# Patient Record
Sex: Male | Born: 1940 | Race: White | Hispanic: No | Marital: Married | Smoking: Never smoker
Health system: Southern US, Community
[De-identification: ages and names within clinical notes are randomized; demographics above are authoritative.]

## PROBLEM LIST (undated history)

## (undated) DIAGNOSIS — E039 Hypothyroidism, unspecified: Secondary | ICD-10-CM

## (undated) DIAGNOSIS — G709 Myoneural disorder, unspecified: Secondary | ICD-10-CM

## (undated) DIAGNOSIS — E079 Disorder of thyroid, unspecified: Secondary | ICD-10-CM

## (undated) DIAGNOSIS — N400 Enlarged prostate without lower urinary tract symptoms: Secondary | ICD-10-CM

## (undated) DIAGNOSIS — K5790 Diverticulosis of intestine, part unspecified, without perforation or abscess without bleeding: Secondary | ICD-10-CM

## (undated) HISTORY — PX: TONSILLECTOMY: SUR1361

## (undated) HISTORY — PX: OTHER SURGICAL HISTORY: SHX169

---

## 2018-11-16 ENCOUNTER — Encounter: Payer: Self-pay | Admitting: *Deleted

## 2018-11-17 ENCOUNTER — Ambulatory Visit: Payer: Medicare Other | Admitting: Anesthesiology

## 2018-11-17 ENCOUNTER — Encounter
Admission: RE | Disposition: A | Discharge status: Home / Self Care | Payer: Self-pay | Source: Home / Self Care | Attending: Ophthalmology

## 2018-11-17 ENCOUNTER — Ambulatory Visit
Admission: RE | Admit: 2018-11-17 | Discharge: 2018-11-17 | Disposition: A | Discharge status: Home / Self Care | Payer: Medicare Other | Attending: Ophthalmology | Admitting: Ophthalmology

## 2018-11-17 ENCOUNTER — Encounter: Payer: Self-pay | Admitting: *Deleted

## 2018-11-17 DIAGNOSIS — G629 Polyneuropathy, unspecified: Secondary | ICD-10-CM | POA: Diagnosis not present

## 2018-11-17 DIAGNOSIS — N400 Enlarged prostate without lower urinary tract symptoms: Secondary | ICD-10-CM | POA: Diagnosis not present

## 2018-11-17 DIAGNOSIS — E039 Hypothyroidism, unspecified: Secondary | ICD-10-CM | POA: Insufficient documentation

## 2018-11-17 DIAGNOSIS — Z79899 Other long term (current) drug therapy: Secondary | ICD-10-CM | POA: Diagnosis not present

## 2018-11-17 DIAGNOSIS — H2512 Age-related nuclear cataract, left eye: Secondary | ICD-10-CM | POA: Insufficient documentation

## 2018-11-17 HISTORY — DX: Benign prostatic hyperplasia without lower urinary tract symptoms: N40.0

## 2018-11-17 HISTORY — DX: Disorder of thyroid, unspecified: E07.9

## 2018-11-17 HISTORY — DX: Hypothyroidism, unspecified: E03.9

## 2018-11-17 HISTORY — DX: Diverticulosis of intestine, part unspecified, without perforation or abscess without bleeding: K57.90

## 2018-11-17 HISTORY — PX: CATARACT EXTRACTION W/PHACO: SHX586

## 2018-11-17 HISTORY — DX: Myoneural disorder, unspecified: G70.9

## 2018-11-17 SURGERY — PHACOEMULSIFICATION, CATARACT, WITH IOL INSERTION
Anesthesia: Monitor Anesthesia Care | Site: Eye | Laterality: Left

## 2018-11-17 MED ORDER — NA CHONDROIT SULF-NA HYALURON 40-17 MG/ML IO SOLN
INTRAOCULAR | Status: DC | PRN
  Administered 2018-11-17: 1 mL via INTRAOCULAR

## 2018-11-17 MED ORDER — FENTANYL CITRATE (PF) 100 MCG/2ML IJ SOLN
INTRAMUSCULAR | Status: DC | PRN
  Administered 2018-11-17: 50 ug via INTRAVENOUS

## 2018-11-17 MED ORDER — ARMC OPHTHALMIC DILATING DROPS
OPHTHALMIC | Status: AC
  Administered 2018-11-17: 1 via OPHTHALMIC
  Filled 2018-11-17: qty 0.5

## 2018-11-17 MED ORDER — MOXIFLOXACIN HCL 0.5 % OP SOLN: 1.0000 [drp] | Freq: Once | OPHTHALMIC | Status: DC

## 2018-11-17 MED ORDER — TETRACAINE HCL 0.5 % OP SOLN
OPHTHALMIC | Status: AC
  Administered 2018-11-17: 1 [drp] via OPHTHALMIC
  Filled 2018-11-17: qty 4

## 2018-11-17 MED ORDER — ARMC OPHTHALMIC DILATING DROPS
1.0000 "application " | OPHTHALMIC | Status: AC
  Administered 2018-11-17 (×2): 1 via OPHTHALMIC

## 2018-11-17 MED ORDER — EPINEPHRINE PF 1 MG/ML IJ SOLN
INTRAOCULAR | Status: DC | PRN
  Administered 2018-11-17: 1 mL via OPHTHALMIC

## 2018-11-17 MED ORDER — POVIDONE-IODINE 5 % OP SOLN
OPHTHALMIC | Status: AC
  Filled 2018-11-17: qty 30

## 2018-11-17 MED ORDER — FENTANYL CITRATE (PF) 100 MCG/2ML IJ SOLN
INTRAMUSCULAR | Status: AC
  Filled 2018-11-17: qty 2

## 2018-11-17 MED ORDER — MOXIFLOXACIN HCL 0.5 % OP SOLN
OPHTHALMIC | Status: AC
  Filled 2018-11-17: qty 3

## 2018-11-17 MED ORDER — TETRACAINE HCL 0.5 % OP SOLN
1.0000 [drp] | Freq: Once | OPHTHALMIC | Status: AC
  Administered 2018-11-17 (×2): 1 [drp] via OPHTHALMIC

## 2018-11-17 MED ORDER — LIDOCAINE HCL (PF) 4 % IJ SOLN
INTRAOCULAR | Status: DC | PRN
  Administered 2018-11-17: 2 mL via OPHTHALMIC

## 2018-11-17 MED ORDER — POVIDONE-IODINE 5 % OP SOLN
OPHTHALMIC | Status: DC | PRN
  Administered 2018-11-17: 1 via OPHTHALMIC

## 2018-11-17 MED ORDER — NA CHONDROIT SULF-NA HYALURON 40-17 MG/ML IO SOLN
INTRAOCULAR | Status: AC
  Filled 2018-11-17: qty 1

## 2018-11-17 MED ORDER — MIDAZOLAM HCL 2 MG/2ML IJ SOLN
INTRAMUSCULAR | Status: AC
  Filled 2018-11-17: qty 2

## 2018-11-17 MED ORDER — LIDOCAINE HCL (PF) 4 % IJ SOLN
INTRAMUSCULAR | Status: AC
  Filled 2018-11-17: qty 5

## 2018-11-17 MED ORDER — CARBACHOL 0.01 % IO SOLN
INTRAOCULAR | Status: DC | PRN
  Administered 2018-11-17: .5 mL via INTRAOCULAR

## 2018-11-17 MED ORDER — MIDAZOLAM HCL 2 MG/2ML IJ SOLN
INTRAMUSCULAR | Status: DC | PRN
  Administered 2018-11-17: .5 mg via INTRAVENOUS

## 2018-11-17 MED ORDER — EPINEPHRINE PF 1 MG/ML IJ SOLN
INTRAMUSCULAR | Status: AC
  Filled 2018-11-17: qty 1

## 2018-11-17 MED ORDER — MOXIFLOXACIN HCL 0.5 % OP SOLN
OPHTHALMIC | Status: DC | PRN
  Administered 2018-11-17: .2 mL via OPHTHALMIC

## 2018-11-17 MED ORDER — SODIUM CHLORIDE 0.9 % IV SOLN
INTRAVENOUS | Status: DC
  Administered 2018-11-17: 09:00:00 via INTRAVENOUS

## 2018-11-17 SURGICAL SUPPLY — 16 items
GLOVE BIO SURGEON STRL SZ8 (GLOVE) ×3 IMPLANT
GLOVE BIOGEL M 6.5 STRL (GLOVE) ×3 IMPLANT
GLOVE SURG LX 8.0 MICRO (GLOVE) ×2
GLOVE SURG LX STRL 8.0 MICRO (GLOVE) ×1 IMPLANT
GOWN STRL REUS W/ TWL LRG LVL3 (GOWN DISPOSABLE) ×2 IMPLANT
GOWN STRL REUS W/TWL LRG LVL3 (GOWN DISPOSABLE) ×4
LABEL CATARACT MEDS ST (LABEL) ×3 IMPLANT
LENS IOL TECNIS ITEC 16.5 (Intraocular Lens) ×2 IMPLANT
PACK CATARACT (MISCELLANEOUS) ×3 IMPLANT
PACK CATARACT BRASINGTON LX (MISCELLANEOUS) ×3 IMPLANT
PACK EYE AFTER SURG (MISCELLANEOUS) ×3 IMPLANT
SOL BSS BAG (MISCELLANEOUS) ×3
SOLUTION BSS BAG (MISCELLANEOUS) ×1 IMPLANT
SYR 5ML LL (SYRINGE) ×3 IMPLANT
WATER STERILE IRR 250ML POUR (IV SOLUTION) ×3 IMPLANT
WIPE NON LINTING 3.25X3.25 (MISCELLANEOUS) ×3 IMPLANT

## 2018-11-17 NOTE — H&P (Signed)
All labs reviewed. Abnormal studies sent to patients PCP when indicated.  Previous H&P reviewed, patient examined, there are NO CHANGES.  Tony Andringa Porfilio12/17/201910:12 AM

## 2018-11-17 NOTE — Discharge Instructions (Signed)
Eye Surgery Discharge Instructions    Expect mild scratchy sensation or mild soreness. DO NOT RUB YOUR EYE!  The day of surgery:  Minimal physical activity, but bed rest is not required  No reading, computer work, or close hand work  No bending, lifting, or straining.  May watch TV  For 24 hours:  No driving, legal decisions, or alcoholic beverages  Safety precautions  Eat anything you prefer: It is better to start with liquids, then soup then solid foods.  Solar shield eyeglasses should be worn for comfort in the sunlight/patch while sleeping  FOLLOW DR. PORFILIO'S EYE DROP INSTRUCTIONS.  Resume all regular medications including aspirin or Coumadin if these were discontinued prior to surgery. You may shower, bathe, shave, or wash your hair. Tylenol may be taken for mild discomfort.  Call your doctor if you experience significant pain, nausea, or vomiting, fever > 101 or other signs of infection. [phone] or [phone] Specific instructions:  Follow-up Information    Galen ManilaPorfilio, William, MD Follow up on 11/18/2018.   Specialty:  Ophthalmology Why:  at 9:45am Contact information: [address] [place] [address] [phone]

## 2018-11-17 NOTE — Anesthesia Preprocedure Evaluation (Signed)
Anesthesia Evaluation  Patient identified by MRN, date of birth, ID band Patient awake    Reviewed: Allergy & Precautions, H&P , NPO status , Patient's Chart, lab work & pertinent test results, reviewed documented beta blocker date and time   History of Anesthesia Complications Negative for: history of anesthetic complications  Airway Mallampati: I  TM Distance: >3 FB Neck ROM: full    Dental  (+) Dental Advidsory Given, Caps, Teeth Intact   Pulmonary neg pulmonary ROS,           Cardiovascular Exercise Tolerance: Good negative cardio ROS       Neuro/Psych negative neurological ROS  negative psych ROS   GI/Hepatic negative GI ROS, Neg liver ROS,   Endo/Other  Hypothyroidism   Renal/GU negative Renal ROS  negative genitourinary   Musculoskeletal   Abdominal   Peds  Hematology negative hematology ROS (+)   Anesthesia Other Findings Past Medical History: No date: BPH (benign prostatic hyperplasia) No date: Diverticulosis No date: Hypothyroidism No date: Neuromuscular disorder (HCC)     Comment:  neuropathy No date: Thyroid disorder   Reproductive/Obstetrics negative OB ROS                             Anesthesia Physical Anesthesia Plan  ASA: II  Anesthesia Plan: General and MAC   Post-op Pain Management:    Induction: Intravenous  PONV Risk Score and Plan: Midazolam  Airway Management Planned: Nasal Cannula  Additional Equipment:   Intra-op Plan:   Post-operative Plan:   Informed Consent: I have reviewed the patients History and Physical, chart, labs and discussed the procedure including the risks, benefits and alternatives for the proposed anesthesia with the patient or authorized representative who has indicated his/her understanding and acceptance.   Dental Advisory Given  Plan Discussed with: Anesthesiologist, CRNA and Surgeon  Anesthesia Plan Comments:          Anesthesia Quick Evaluation

## 2018-11-17 NOTE — Op Note (Signed)
PREOPERATIVE DIAGNOSIS:  Nuclear sclerotic cataract of the left eye.   POSTOPERATIVE DIAGNOSIS:  Nuclear sclerotic cataract of the left eye.   OPERATIVE PROCEDURE: Procedure(s): CATARACT EXTRACTION PHACO AND INTRAOCULAR LENS PLACEMENT (IOC) LEFT   SURGEON:  Galen ManilaWilliam Hazaiah Edgecombe, MD.   ANESTHESIA:  Anesthesiologist: Lenard SimmerKarenz, Andrew, MD  1.      Managed anesthesia care. 2.     0.211ml of Shugarcaine was instilled following the paracentesis   COMPLICATIONS:  None.   TECHNIQUE:   Stop and chop   DESCRIPTION OF PROCEDURE:  The patient was examined and consented in the preoperative holding area where the aforementioned topical anesthesia was applied to the left eye and then brought back to the Operating Room where the left eye was prepped and draped in the usual sterile ophthalmic fashion and a lid speculum was placed. A paracentesis was created with the side port blade and the anterior chamber was filled with viscoelastic. A near clear corneal incision was performed with the steel keratome. A continuous curvilinear capsulorrhexis was performed with a cystotome followed by the capsulorrhexis forceps. Hydrodissection and hydrodelineation were carried out with BSS on a blunt cannula. The lens was removed in a stop and chop  technique and the remaining cortical material was removed with the irrigation-aspiration handpiece. The capsular bag was inflated with viscoelastic and the Technis ZCB00 lens was placed in the capsular bag without complication. The remaining viscoelastic was removed from the eye with the irrigation-aspiration handpiece. The wounds were hydrated. The anterior chamber was flushed with Miostat and the eye was inflated to physiologic pressure. 0.211ml Vigamox was placed in the anterior chamber. The wounds were found to be water tight. The eye was dressed with Vigamox. The patient was given protective glasses to wear throughout the day and a shield with which to sleep tonight. The patient was also  given drops with which to begin a drop regimen today and will follow-up with me in one day. Implant Name Type Inv. Item Serial No. Manufacturer Lot No. LRB No. Used  LENS IOL DIOP 16.5 - [id number] Intraocular Lens LENS IOL DIOP 16.5 [id number]  Left 1    Procedure(s) with comments: CATARACT EXTRACTION PHACO AND INTRAOCULAR LENS PLACEMENT (IOC) LEFT (Left) - US 00:48  CDE 7.81 Fluid pack lot # [id number]  Electronically signed: Galen ManilaWilliam Kerriann Kamphuis 11/17/2018 10:40 AM

## 2018-11-17 NOTE — Anesthesia Procedure Notes (Signed)
Procedure Name: MAC Date/Time: 11/17/2018 10:18 AM Performed by: Allean Found, CRNA Pre-anesthesia Checklist: Patient identified, Emergency Drugs available, Suction available, Patient being monitored and Timeout performed Patient Re-evaluated:Patient Re-evaluated prior to induction Oxygen Delivery Method: Nasal cannula Placement Confirmation: positive ETCO2

## 2018-11-17 NOTE — Anesthesia Post-op Follow-up Note (Signed)
Anesthesia QCDR form completed.        

## 2018-11-17 NOTE — Transfer of Care (Addendum)
Immediate Anesthesia Transfer of Care Note  Patient: Tony Hays  Procedure(s) Performed: CATARACT EXTRACTION PHACO AND INTRAOCULAR LENS PLACEMENT (IOC) LEFT (Left Eye)  Patient Location: PACU and Short Stay  Anesthesia Type:MAC  Level of Consciousness: awake and alert   Airway & Oxygen Therapy: Patient Spontanous Breathing  Post-op Assessment: Report given to RN  Post vital signs: Reviewed and stable  Last Vitals:  Vitals Value Taken Time  BP  134/76  Temp    Pulse  50  Resp    SpO2  100    Last Pain: There were no vitals filed for this visit.       Complications: No apparent anesthesia complications

## 2018-11-17 NOTE — Anesthesia Postprocedure Evaluation (Signed)
Anesthesia Post Note  Patient: Tony Hays  Procedure(s) Performed: CATARACT EXTRACTION PHACO AND INTRAOCULAR LENS PLACEMENT (IOC) LEFT (Left Eye)  Patient location during evaluation: PACU Anesthesia Type: MAC Level of consciousness: awake and alert Pain management: pain level controlled Vital Signs Assessment: post-procedure vital signs reviewed and stable Respiratory status: spontaneous breathing, nonlabored ventilation, respiratory function stable and patient connected to nasal cannula oxygen Cardiovascular status: stable and blood pressure returned to baseline Postop Assessment: no apparent nausea or vomiting Anesthetic complications: no     Last Vitals:  Vitals:   11/17/18 1044  BP: 134/76  Pulse: (!) 50  Resp: 16  Temp: 36.4 C  SpO2: 100%    Last Pain:  Vitals:   11/17/18 1044  TempSrc: Temporal  PainSc: 0-No pain                 Martha Clan

## 2018-12-14 ENCOUNTER — Encounter: Payer: Self-pay | Admitting: *Deleted

## 2018-12-15 ENCOUNTER — Encounter
Admission: RE | Disposition: A | Discharge status: Home / Self Care | Payer: Self-pay | Source: Home / Self Care | Attending: Ophthalmology

## 2018-12-15 ENCOUNTER — Other Ambulatory Visit: Payer: Self-pay

## 2018-12-15 ENCOUNTER — Ambulatory Visit: Payer: Medicare Other | Admitting: Certified Registered Nurse Anesthetist

## 2018-12-15 ENCOUNTER — Ambulatory Visit
Admission: RE | Admit: 2018-12-15 | Discharge: 2018-12-15 | Disposition: A | Discharge status: Home / Self Care | Payer: Medicare Other | Attending: Ophthalmology | Admitting: Ophthalmology

## 2018-12-15 DIAGNOSIS — Z79890 Hormone replacement therapy (postmenopausal): Secondary | ICD-10-CM | POA: Insufficient documentation

## 2018-12-15 DIAGNOSIS — Z79899 Other long term (current) drug therapy: Secondary | ICD-10-CM | POA: Diagnosis not present

## 2018-12-15 DIAGNOSIS — H2511 Age-related nuclear cataract, right eye: Secondary | ICD-10-CM | POA: Diagnosis present

## 2018-12-15 DIAGNOSIS — E039 Hypothyroidism, unspecified: Secondary | ICD-10-CM | POA: Insufficient documentation

## 2018-12-15 HISTORY — PX: CATARACT EXTRACTION W/PHACO: SHX586

## 2018-12-15 SURGERY — PHACOEMULSIFICATION, CATARACT, WITH IOL INSERTION
Anesthesia: Monitor Anesthesia Care | Site: Eye | Laterality: Right

## 2018-12-15 MED ORDER — TETRACAINE HCL 0.5 % OP SOLN
1.0000 [drp] | OPHTHALMIC | Status: AC | PRN
  Administered 2018-12-15 (×3): 1 [drp] via OPHTHALMIC

## 2018-12-15 MED ORDER — ARMC OPHTHALMIC DILATING DROPS
1.0000 "application " | OPHTHALMIC | Status: AC
  Administered 2018-12-15 (×3): 1 via OPHTHALMIC

## 2018-12-15 MED ORDER — SODIUM CHLORIDE 0.9 % IV SOLN
INTRAVENOUS | Status: DC
  Administered 2018-12-15: 10:00:00 via INTRAVENOUS

## 2018-12-15 MED ORDER — NA CHONDROIT SULF-NA HYALURON 40-17 MG/ML IO SOLN
INTRAOCULAR | Status: DC | PRN
  Administered 2018-12-15: 1 mL via INTRAOCULAR

## 2018-12-15 MED ORDER — MOXIFLOXACIN HCL 0.5 % OP SOLN: 1.0000 [drp] | OPHTHALMIC | Status: DC | PRN

## 2018-12-15 MED ORDER — MIDAZOLAM HCL 2 MG/2ML IJ SOLN
INTRAMUSCULAR | Status: AC
  Filled 2018-12-15: qty 2

## 2018-12-15 MED ORDER — LIDOCAINE HCL (PF) 4 % IJ SOLN
INTRAOCULAR | Status: DC | PRN
  Administered 2018-12-15: 2 mL via OPHTHALMIC

## 2018-12-15 MED ORDER — MOXIFLOXACIN HCL 0.5 % OP SOLN
OPHTHALMIC | Status: AC
  Filled 2018-12-15: qty 3

## 2018-12-15 MED ORDER — EPINEPHRINE PF 1 MG/ML IJ SOLN
INTRAOCULAR | Status: DC | PRN
  Administered 2018-12-15: 1 mL via OPHTHALMIC

## 2018-12-15 MED ORDER — CARBACHOL 0.01 % IO SOLN
INTRAOCULAR | Status: DC | PRN
  Administered 2018-12-15: .5 mL via INTRAOCULAR

## 2018-12-15 MED ORDER — POVIDONE-IODINE 5 % OP SOLN
OPHTHALMIC | Status: DC | PRN
  Administered 2018-12-15: 1 via OPHTHALMIC

## 2018-12-15 MED ORDER — MOXIFLOXACIN HCL 0.5 % OP SOLN
OPHTHALMIC | Status: DC | PRN
  Administered 2018-12-15: .2 mL via OPHTHALMIC

## 2018-12-15 MED ORDER — ARMC OPHTHALMIC DILATING DROPS
OPHTHALMIC | Status: AC
  Administered 2018-12-15: 1 via OPHTHALMIC
  Filled 2018-12-15: qty 0.5

## 2018-12-15 MED ORDER — TETRACAINE HCL 0.5 % OP SOLN
OPHTHALMIC | Status: AC
  Administered 2018-12-15: 1 [drp] via OPHTHALMIC
  Filled 2018-12-15: qty 4

## 2018-12-15 MED ORDER — MIDAZOLAM HCL 2 MG/2ML IJ SOLN
INTRAMUSCULAR | Status: DC | PRN
  Administered 2018-12-15 (×2): 1 mg via INTRAVENOUS

## 2018-12-15 SURGICAL SUPPLY — 16 items
GLOVE BIO SURGEON STRL SZ8 (GLOVE) ×2 IMPLANT
GLOVE BIOGEL M 6.5 STRL (GLOVE) ×2 IMPLANT
GLOVE SURG LX 8.0 MICRO (GLOVE) ×1
GLOVE SURG LX STRL 8.0 MICRO (GLOVE) ×1 IMPLANT
GOWN STRL REUS W/ TWL LRG LVL3 (GOWN DISPOSABLE) ×2 IMPLANT
GOWN STRL REUS W/TWL LRG LVL3 (GOWN DISPOSABLE) ×2
LABEL CATARACT MEDS ST (LABEL) ×2 IMPLANT
LENS IOL TECNIS ITEC 16.0 (Intraocular Lens) ×1 IMPLANT
PACK CATARACT (MISCELLANEOUS) ×2 IMPLANT
PACK CATARACT BRASINGTON LX (MISCELLANEOUS) ×2 IMPLANT
PACK EYE AFTER SURG (MISCELLANEOUS) ×2 IMPLANT
SOL BSS BAG (MISCELLANEOUS) ×2
SOLUTION BSS BAG (MISCELLANEOUS) ×1 IMPLANT
SYR 5ML LL (SYRINGE) ×2 IMPLANT
WATER STERILE IRR 250ML POUR (IV SOLUTION) ×2 IMPLANT
WIPE NON LINTING 3.25X3.25 (MISCELLANEOUS) ×2 IMPLANT

## 2018-12-15 NOTE — Op Note (Signed)
PREOPERATIVE DIAGNOSIS:  Nuclear sclerotic cataract of the right eye.   POSTOPERATIVE DIAGNOSIS:  nuclear sclerotic cataract right eye   OPERATIVE PROCEDURE: Procedure(s): CATARACT EXTRACTION PHACO AND INTRAOCULAR LENS PLACEMENT (IOC) RIGHT   SURGEON:  Galen Manila, MD.   ANESTHESIA:  Anesthesiologist: Piscitello, Cleda Mccreedy, MD CRNA: Dava Najjar, CRNA  1.      Managed anesthesia care. 2.      0.15ml of Shugarcaine was instilled in the eye following the paracentesis.   COMPLICATIONS:  None.   TECHNIQUE:   Stop and chop   DESCRIPTION OF PROCEDURE:  The patient was examined and consented in the preoperative holding area where the aforementioned topical anesthesia was applied to the right eye and then brought back to the Operating Room where the right eye was prepped and draped in the usual sterile ophthalmic fashion and a lid speculum was placed. A paracentesis was created with the side port blade and the anterior chamber was filled with viscoelastic. A near clear corneal incision was performed with the steel keratome. A continuous curvilinear capsulorrhexis was performed with a cystotome followed by the capsulorrhexis forceps. Hydrodissection and hydrodelineation were carried out with BSS on a blunt cannula. The lens was removed in a stop and chop  technique and the remaining cortical material was removed with the irrigation-aspiration handpiece. The capsular bag was inflated with viscoelastic and the Technis ZCB00  lens was placed in the capsular bag without complication. The remaining viscoelastic was removed from the eye with the irrigation-aspiration handpiece. The wounds were hydrated. The anterior chamber was flushed with Miostat and the eye was inflated to physiologic pressure. 0.20ml of Vigamox was placed in the anterior chamber. The wounds were found to be water tight. The eye was dressed with Vigamox. The patient was given protective glasses to wear throughout the day and a shield  with which to sleep tonight. The patient was also given drops with which to begin a drop regimen today and will follow-up with me in one day. Implant Name Type Inv. Item Serial No. Manufacturer Lot No. LRB No. Used  LENS IOL DIOP 16.0 - [id number] Intraocular Lens LENS IOL DIOP 16.0 [id number] AMO  Right 1   Procedure(s) with comments: CATARACT EXTRACTION PHACO AND INTRAOCULAR LENS PLACEMENT (IOC) RIGHT (Right) - [place]  00:40 CDE 4.63 Fluid pack lot # [id number]  Electronically signed: Galen Manila 12/15/2018 11:09 AM

## 2018-12-15 NOTE — Discharge Instructions (Addendum)
Eye Surgery Discharge Instructions  Expect mild scratchy sensation or mild soreness. DO NOT RUB YOUR EYE!  The day of surgery:  Minimal physical activity, but bed rest is not required  No reading, computer work, or close hand work  No bending, lifting, or straining.  May watch TV  For 24 hours:  No driving, legal decisions, or alcoholic beverages  Safety precautions  Eat anything you prefer: It is better to start with liquids, then soup then solid foods.   Solar shield eyeglasses should be worn for comfort in the sunlight/patch while sleeping  Resume all regular medications including aspirin or Coumadin if these were discontinued prior to surgery. You may shower, bathe, shave, or wash your hair. Tylenol may be taken for mild discomfort. FOLLOW DR. PORFILIO'S EYE DROP INSTRUCTION SHEET AS REVIEWED.  Call your doctor if you experience significant pain, nausea, or vomiting, fever > 101 or other signs of infection. [phone] or [phone] Specific instructions:  Follow-up Information    Galen Manila, MD Follow up.   Specialty:  Ophthalmology Why:  09/16/19 @ 9:35 am Contact information: [address] [place] [address] [phone]

## 2018-12-15 NOTE — H&P (Signed)
All labs reviewed. Abnormal studies sent to patients PCP when indicated.  Previous H&P reviewed, patient examined, there are NO CHANGES.  Tony Belmonte Porfilio1/14/202010:47 AM

## 2018-12-15 NOTE — Transfer of Care (Signed)
Immediate Anesthesia Transfer of Care Note  Patient: Tony Hays  Procedure(s) Performed: CATARACT EXTRACTION PHACO AND INTRAOCULAR LENS PLACEMENT (IOC) RIGHT (Right Eye)  Patient Location: Short Stay  Anesthesia Type:MAC  Level of Consciousness: awake, alert , oriented and patient cooperative  Airway & Oxygen Therapy: Patient Spontanous Breathing  Post-op Assessment: Report given to RN and Post -op Vital signs reviewed and stable  Post vital signs: Reviewed and stable  Last Vitals:  Vitals Value Taken Time  BP    Temp    Pulse    Resp    SpO2      Last Pain:  Vitals:   12/15/18 0936  TempSrc: Oral  PainSc: 0-No pain         Complications: No apparent anesthesia complications

## 2018-12-15 NOTE — Anesthesia Preprocedure Evaluation (Addendum)
Anesthesia Evaluation  Patient identified by MRN, date of birth, ID band Patient awake    Reviewed: Allergy & Precautions, H&P , NPO status , Patient's Chart, lab work & pertinent test results, reviewed documented beta blocker date and time   History of Anesthesia Complications Negative for: history of anesthetic complications  Airway Mallampati: II  TM Distance: >3 FB Neck ROM: limited    Dental  (+) Dental Advidsory Given, Caps, Chipped   Pulmonary neg pulmonary ROS,           Cardiovascular Exercise Tolerance: Good negative cardio ROS       Neuro/Psych  Neuromuscular disease negative psych ROS   GI/Hepatic negative GI ROS, Neg liver ROS,   Endo/Other  Hypothyroidism   Renal/GU negative Renal ROS  negative genitourinary   Musculoskeletal   Abdominal   Peds  Hematology negative hematology ROS (+)   Anesthesia Other Findings Past Medical History: No date: BPH (benign prostatic hyperplasia) No date: Diverticulosis No date: Hypothyroidism No date: Neuromuscular disorder (HCC)     Comment:  neuropathy No date: Thyroid disorder   Reproductive/Obstetrics negative OB ROS                            Anesthesia Physical  Anesthesia Plan  ASA: III  Anesthesia Plan: General and MAC   Post-op Pain Management:    Induction: Intravenous  PONV Risk Score and Plan: Midazolam  Airway Management Planned: Nasal Cannula  Additional Equipment:   Intra-op Plan:   Post-operative Plan:   Informed Consent: I have reviewed the patients History and Physical, chart, labs and discussed the procedure including the risks, benefits and alternatives for the proposed anesthesia with the patient or authorized representative who has indicated his/her understanding and acceptance.     Dental Advisory Given  Plan Discussed with: Anesthesiologist, CRNA and Surgeon  Anesthesia Plan Comments: (Patient  consented for risks of anesthesia including but not limited to:  - adverse reactions to medications - damage to teeth, lips or other oral mucosa - sore throat or hoarseness - Damage to heart, brain, lungs or loss of life  Patient voiced understanding.)        Anesthesia Quick Evaluation

## 2018-12-15 NOTE — Anesthesia Post-op Follow-up Note (Signed)
Anesthesia QCDR form completed.        

## 2018-12-15 NOTE — Anesthesia Postprocedure Evaluation (Signed)
Anesthesia Post Note  Patient: [name]  Procedure(s) Performed: CATARACT EXTRACTION PHACO AND INTRAOCULAR LENS PLACEMENT (IOC) RIGHT (Right Eye)  Patient location during evaluation: Short Stay Anesthesia Type: MAC Level of consciousness: awake and alert, oriented and patient cooperative Pain management: satisfactory to patient Vital Signs Assessment: post-procedure vital signs reviewed and stable Respiratory status: spontaneous breathing, nonlabored ventilation and respiratory function stable Cardiovascular status: blood pressure returned to baseline and stable Postop Assessment: no headache and no apparent nausea or vomiting Anesthetic complications: no     Last Vitals:  Vitals:   12/15/18 0936 12/15/18 1111  BP: 140/79 130/76  Pulse: (!) 56 (!) 55  Resp: 18 16  Temp: 36.5 C (!) 36.2 C  SpO2: 100% 100%    Last Pain:  Vitals:   12/15/18 1111  TempSrc: Temporal  PainSc: 0-No pain                 Eben Burow

## 2018-12-16 ENCOUNTER — Encounter: Payer: Self-pay | Admitting: Ophthalmology

## 2019-09-13 ENCOUNTER — Other Ambulatory Visit: Payer: Self-pay | Admitting: Neurology

## 2019-09-13 ENCOUNTER — Other Ambulatory Visit (HOSPITAL_COMMUNITY): Payer: Self-pay | Admitting: Neurology

## 2019-09-13 DIAGNOSIS — R413 Other amnesia: Secondary | ICD-10-CM

## 2019-09-24 ENCOUNTER — Other Ambulatory Visit: Payer: Self-pay

## 2019-09-24 ENCOUNTER — Ambulatory Visit (HOSPITAL_COMMUNITY)
Admission: RE | Admit: 2019-09-24 | Discharge: 2019-09-24 | Disposition: A | Discharge status: Home / Self Care | Payer: Medicare Other | Source: Ambulatory Visit | Attending: Neurology | Admitting: Neurology

## 2019-09-24 DIAGNOSIS — R413 Other amnesia: Secondary | ICD-10-CM | POA: Diagnosis not present

## 2019-10-25 ENCOUNTER — Other Ambulatory Visit: Payer: Self-pay

## 2019-10-25 ENCOUNTER — Ambulatory Visit: Payer: Medicare Other | Attending: Neurology | Admitting: Speech Pathology

## 2019-10-25 ENCOUNTER — Encounter: Payer: Self-pay | Admitting: Speech Pathology

## 2019-10-25 DIAGNOSIS — R41841 Cognitive communication deficit: Secondary | ICD-10-CM | POA: Diagnosis not present

## 2019-10-25 NOTE — Therapy (Signed)
[facility] MAIN [facility] [address] [place], [place], [address] Phone: [phone]   Fax:  [phone]  Speech Language Pathology Evaluation  Patient Details  Name: Tony Hays MRN: [id number] Date of Birth: [birth year] No data recorded  Encounter Date: 10/25/2019  End of Session - 10/25/19 1045    Visit Number  1    Number of Visits  9    Date for SLP Re-Evaluation  12/25/19    SLP Start Time  0800    SLP Stop Time   0845    SLP Time Calculation (min)  45 min    Activity Tolerance  Patient tolerated treatment well       Past Medical History:  Diagnosis Date  . BPH (benign prostatic hyperplasia)   . Diverticulosis   . Hypothyroidism   . Neuromuscular disorder (HCC)    neuropathy  . Thyroid disorder     Past Surgical History:  Procedure Laterality Date  . CATARACT EXTRACTION W/PHACO Left 11/17/2018   Procedure: CATARACT EXTRACTION PHACO AND INTRAOCULAR LENS PLACEMENT (IOC) LEFT;  Surgeon: Galen ManilaPorfilio, William, MD;  Location: [facility] ORS;  Service: Ophthalmology;  Laterality: Left;  US 00:48  CDE 7.81 Fluid pack lot # [id number]  . CATARACT EXTRACTION W/PHACO Right 12/15/2018   Procedure: CATARACT EXTRACTION PHACO AND INTRAOCULAR LENS PLACEMENT (IOC) RIGHT;  Surgeon: Galen ManilaPorfilio, William, MD;  Location: [facility] ORS;  Service: Ophthalmology;  Laterality: Right;  US  00:40 CDE 4.63 Fluid pack lot # [id number]  . saliva gland removed    . TONSILLECTOMY      There were no vitals filed for this visit.  Subjective Assessment - 10/25/19 1033    Subjective  Pt was very pleasant, cooperative, and a good historian. Wife present and supportive.    Patient is accompained by:  Family member   wife   Currently in Pain?  No/denies         [facility] - 10/25/19 1033      SLP Visit Information   SLP Received On  10/25/19    Onset Date  Gradually over past 2 years    Medical Diagnosis  Mild cognitive impairment      Subjective    Subjective  Pt was alert, cooperative and completed nearly all evaluation tasks with ease.      General Information   HPI  Pt seen for evaluation of Dementia by Dr. 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 Pt reports gradual decline in his memory since approx 2018. He stated he has lapses in both short term and long term memory. He stated he has trouble learning new information mainly due to vision impairment. He reported he will forget items when he leaving the house. Pt stopped driving due to visual impairment. Pt stays active by riding his bike daily. He manages his own medications, and does not require any assistance with ADL's. He has stopped driving due to poor vision. Pt would like to improve his memory skills to pre-morbid level.   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 [employer]     Cognition   Overall Cognitive Status  Impaired/Different from baseline    Area of Impairment  Memory    Memory  Decreased short-term memory    Memory Comments  Mild     Memory  Impaired    Memory Impairment  Storage deficit;Retrieval deficit;Decreased recall of new information;Decreased short term memory    Awareness  Appears intact    Problem Solving  Appears intact      Auditory Comprehension   Overall Auditory Comprehension  Appears within functional limits for tasks assessed      Expression   Primary Mode of Expression  Verbal      Verbal Expression   Overall Verbal Expression  Appears within functional limits for tasks assessed      Oral Motor/Sensory Function   Overall Oral Motor/Sensory Function  Appears within functional limits for tasks assessed      Motor Speech   Overall Motor Speech  Appears within functional limits for tasks assessed      Standardized  Assessments   Standardized Assessments   Other Assessment    Other Assessment  CCAS-Scale        CCAS-Scale (Cerebellar Cognitive Affective Schmahmann Syndrome Scale) Version 1A   Semantic Fluency                   22/26    Pass       Phonemic Fluency                  14/19     Pass               Category Switching                 6/15      Fail (>8=pass) Digit Span Forward                 8/8         Pass Digit Span Backward              2/6        Fail (>2 =pass) Cube (copy)     DNT  Due to Visual impairment                                 Verbal Recall                          12/15       Pass Similarities                               7/8        Pass GO NO-GO                             2/2        Pass Affect                                       6/6        Pass   TOTAL SCORE                      79/105            2 Failed Tests = Mild cognitive impairment         SLP Education - 10/25/19 1045  Education Details  re: role of SLP in cognitive training; compensatory strategies to aid short term recall, working memory    [employer]) Educated  Patient;Spouse    Methods  Explanation;Demonstration;Verbal cues;Handout    Comprehension  Verbalized understanding;Returned demonstration         SLP Long Term Goals - 10/25/19 1048      SLP LONG TERM GOAL #1   Title  Pt will use external and internal memory aids and compensatory strategies independently to aid storage and retrieval to improve short term recall.    Time  8    Period  Weeks    Status  New    Target Date  12/25/19      SLP LONG TERM GOAL #2   Title  After listening to a 5 sentence paragraph, pt will anwer y/n and wh questions with 80% accuracy independently.    Time  8    Period  Weeks    Status  New    Target Date  12/25/19      SLP LONG TERM GOAL #3   Title  Pt will complete auditory attention/vigilance/memory tasks with 80% accuracy given occasional min cues.    Time  8    Period  Weeks    Status  New     Target Date  12/25/19       Plan - 10/25/19 1046    Clinical Impression Statement  This very pleasant and engaging 78 y/o male presents with mild cognitive impairment c/b deficits with immediate, short term and working memory. MRI on 09/26/2019 revealed "prominent hippocampal atrophy and no acute abnormalities." Strengths include all receptive and expressive language skills. Spontaneous speech is fluent, cogent and composed of grammatically correct information dense sentences. Discussed compensatory strategies and working [employer] activities at length with pt and wife who both stated understanding and agreement. Rec. SLP tx 1x/week for 8 weeks to facilitate improvement of memory skills to improve functional independence. Pt expressed wishes to hold on tx for now, and attempt to implement compensatory strategies and practice activities learned today before proceeding with tx.   Speech Therapy Frequency  1x /week    Duration  Other (comment)   8 weeks   Treatment/Interventions  SLP instruction and feedback;Functional tasks;Compensatory strategies;Cognitive reorganization;Patient/family education;Multimodal communcation approach;Cueing hierarchy;Internal/external aids    Potential to Achieve Goals  Good    Potential Considerations  Ability to learn/carryover information;Family/community support;Previous level of function;Cooperation/participation level;Severity of impairments    SLP Home Exercise Plan  Working memory exercises    Consulted and Agree with Plan of Care  Patient;Family member/caregiver    Family Member Consulted  wife       Patient will benefit from skilled therapeutic intervention in order to improve the following deficits and impairments:   Cognitive communication deficit    Problem List There are no active problems to display for this patient.   [place], MA, CCC-SLP 10/25/2019, 11:17 AM  [place] MAIN [facility]  SERVICES [address] [place], [place], [address] Phone: [phone]   Fax:  [phone]  Name: Tony Hays MRN: [id number] Date of Birth: 1941/09/15

## 2020-06-12 ENCOUNTER — Ambulatory Visit
Admission: EM | Admit: 2020-06-12 | Discharge: 2020-06-12 | Disposition: A | Discharge status: Home / Self Care | Payer: Medicare PPO | Attending: Family Medicine | Admitting: Family Medicine

## 2020-06-12 DIAGNOSIS — H6123 Impacted cerumen, bilateral: Secondary | ICD-10-CM | POA: Diagnosis not present

## 2020-06-12 DIAGNOSIS — H65192 Other acute nonsuppurative otitis media, left ear: Secondary | ICD-10-CM | POA: Diagnosis not present

## 2020-06-12 MED ORDER — NEOMYCIN-POLYMYXIN-HC 3.5-10000-1 OT SUSP: 3.0000 [drp] | Freq: Three times a day (TID) | OTIC | 0 refills | Status: AC

## 2020-06-12 NOTE — Discharge Instructions (Signed)
I have prescribed you Cortisporin eardrops to instill 3 drops into the left ear 3 times a day for a total of 10 days.  Following the treatment I would recommend using Debrox eardrops to each ear at least 3 times a week to prevent earwax buildup.  You can ask the pharmacist to show you where to locate the Debrox.

## 2020-06-12 NOTE — ED Provider Notes (Addendum)
Renaldo Fiddler    CSN: [id number] Arrival date & time: 06/12/20  1443      History   Chief Complaint Chief Complaint  Patient presents with  . Ear Fullness    HPI Tony Hays is a 79 y.o. male.   HPI  Patient presents for evaluation of cerumen impaction causing loss of hearing. [facility] and [facility] nurse at the facility referred patient here for an ear irrigation.  Endorses mild discomfort to the left ear although no overt pain. No other URI symptoms. He has tried over the counter ear wax removal treatments without improvement.  Past Medical History:  Diagnosis Date  . BPH (benign prostatic hyperplasia)   . Diverticulosis   . Hypothyroidism   . Neuromuscular disorder (HCC)    neuropathy  . Thyroid disorder     There are no problems to display for this patient.   Past Surgical History:  Procedure Laterality Date  . CATARACT EXTRACTION W/PHACO Left 11/17/2018   Procedure: CATARACT EXTRACTION PHACO AND INTRAOCULAR LENS PLACEMENT (IOC) LEFT;  Surgeon: Galen Manila, MD;  Location: [facility];  Service: Ophthalmology;  Laterality: Left;  [place] 00:48  CDE 7.81 Fluid pack lot # [id number]  . CATARACT EXTRACTION W/PHACO Right 12/15/2018   Procedure: CATARACT EXTRACTION PHACO AND INTRAOCULAR LENS PLACEMENT (IOC) RIGHT;  Surgeon: Galen Manila, MD;  Location: [facility];  Service: Ophthalmology;  Laterality: Right;  [place]  00:40 CDE 4.63 Fluid pack lot # [id number]  . saliva gland removed    . TONSILLECTOMY         Home Medications    Prior to Admission medications   Medication Sig Start Date End Date Taking? Authorizing Provider  Cyanocobalamin (B-12) 5000 MCG CAPS Take 5,000 mcg by mouth daily.    [provider]  LEVOXYL 88 MCG tablet Take 88 mcg by mouth daily before breakfast.    [provider]  Melatonin 5 MG CAPS Take 5 mg by mouth at bedtime.    [provider]  Multiple Vitamin (MULTIVITAMIN WITH MINERALS)  TABS tablet Take 1 tablet by mouth daily.    [provider]  Omega-3 Fatty Acids (FISH OIL PO) Take 1 capsule by mouth daily.    [provider]  Polyvinyl Alcohol (LIQUID TEARS OP) Place 1 drop into both eyes every evening.    [provider]  sodium chloride (OCEAN) 0.65 % SOLN nasal spray Place 1 spray into both nostrils 2 (two) times daily as needed for congestion.    [provider]    Family History History reviewed. No pertinent family history.  Social History Social History   Tobacco Use  . Smoking status: Never Smoker  . Smokeless tobacco: Never Used  Vaping Use  . Vaping Use: Never used  Substance Use Topics  . Alcohol use: Yes  . Drug use: Never     Allergies   Patient has no known allergies. Review of Systems Review of Systems Pertinent negatives listed in HPI  Physical Exam Triage Vital Signs ED Triage Vitals  Enc Vitals Group     BP 06/12/20 1444 119/64     Pulse Rate 06/12/20 1444 81     Resp 06/12/20 1444 14     Temp 06/12/20 1444 98.7 F (37.1 C)     Temp src --      SpO2 06/12/20 1444 96 %     Weight --      Height --      Head  Circumference --      Peak Flow --      Pain Score 06/12/20 1443 0     Pain Loc --      Pain Edu? --      Excl. in GC? --    No data found.  Updated Vital Signs BP 119/64   Pulse 81   Temp 98.7 F (37.1 C)   Resp 14   SpO2 96%   Visual Acuity Right Eye Distance:   Left Eye Distance:   Bilateral Distance:    Right Eye Near:   Left Eye Near:    Bilateral Near:     Physical Exam General appearance: alert, well developed, well nourished, cooperative and in no distress Head: Normocephalic, without obvious abnormality, atraumatic Respiratory: Respirations even and unlabored, normal respiratory rate Heart: rate and rhythm normal. No gallop or murmurs noted on exam  ENT: Bilateral cerumen impaction thicken brown cerumen -Following cerumen removal, left TM abnormally  erythematous with tenderness elicited during exam Skin: Skin color, texture, turgor normal. No rashes seen  Psych: Appropriate mood and affect. Neurologic: Mental status: Alert, oriented to person, place, and time, thought content appropriate.   UC Treatments / Results  Labs (all labs ordered are listed, but only abnormal results are displayed) Labs Reviewed - No data to display  EKG   Radiology No results found.  Procedures Procedures (including critical care time)  Medications Ordered in UC Medications - No data to display  Initial Impression / Assessment and Plan / UC Course  I have reviewed the triage vital signs and the nursing notes.  Pertinent labs & imaging results that were available during my care of the patient were reviewed by me and considered in my medical decision making (see chart for details).    Patient tolerated bilateral cerumen lavage. Left ear otitis media noted following cerumen impaction. Given mild discomfort, will treat with Cortisporin x 10 days. PCP if symptoms worsen or do not improve. Final Clinical Impressions(s) / UC Diagnoses   Final diagnoses:  Other non-recurrent acute nonsuppurative otitis media of left ear  Bilateral hearing loss due to cerumen impaction     Discharge Instructions     I have prescribed you Cortisporin eardrops to instill 3 drops into the left ear 3 times a day for a total of 10 days.  Following the treatment I would recommend using Debrox eardrops to each ear at least 3 times a week to prevent earwax buildup.  You can ask the pharmacist to show you where to locate the Debrox.    ED Prescriptions    Medication Sig Dispense Auth. Provider   neomycin-polymyxin-hydrocortisone (CORTISPORIN) 3.5-10000-1 OTIC suspension Place 3 drops into the left ear 3 (three) times daily for 10 days. 5 mL Bing Neighbors, FNP     PDMP not reviewed this encounter.   Bing Neighbors, FNP 06/12/20 1623    Bing Neighbors,  FNP 06/12/20 1818

## 2020-06-12 NOTE — ED Triage Notes (Signed)
Patient complains of bilateral ear fullness. States the nurse at [facility] told him to come to [facility].

## 2021-02-23 IMAGING — MR MR HEAD W/O CM
11 of 13 series · 36 of 48 positions shown · non-contrast
Comparison: None.

CLINICAL DATA: Memory loss

EXAM:
MRI HEAD WITHOUT CONTRAST
TECHNIQUE: Multiplanar, multiecho pulse sequences of the brain and surrounding
structures were obtained without intravenous contrast.
Additionally, using NeuroQuant software a 3D volumetric analysis of
the brain was performed and is compared to a normative database
adjusted for age, gender and intracranial volume.

[Series 2: FLAIR · sagittal · 5.0mm · 0.51mm/px · 1 of 27 slices shown (1 of 2)]
[im 1/27]
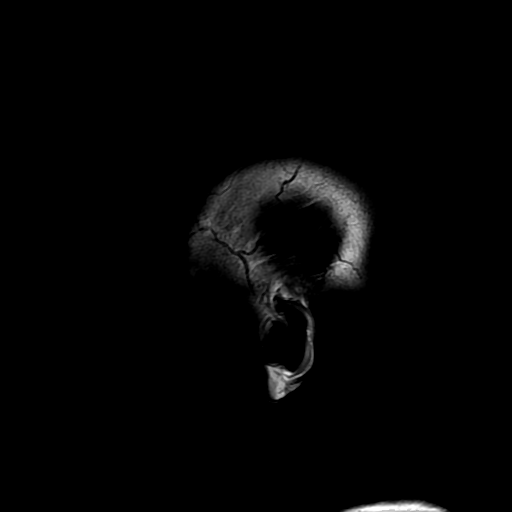

[Series 3: DWI · axial · 3.0mm · 0.94mm/px · z∈[-36,+106]mm · 6 of 100 slices shown (1 of 4)]
[im 1/100]
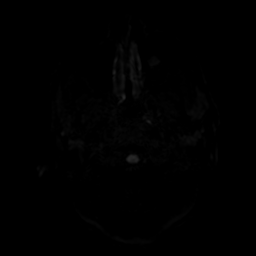
[im 20/100]
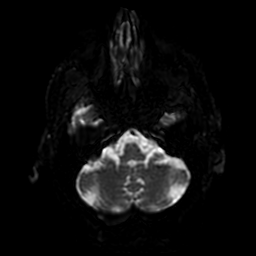
[im 40/100]
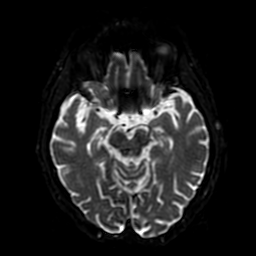
[im 60/100]
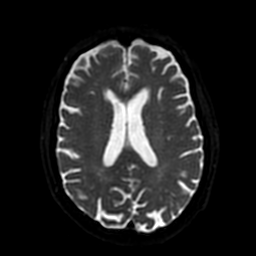
[im 80/100]
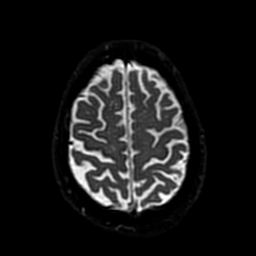
[im 100/100]
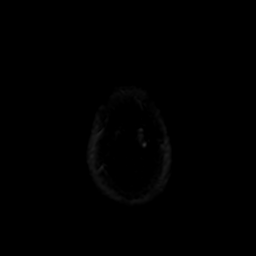

[Series 4: T2 · axial · 5.0mm · 0.47mm/px · z∈[-52,+107]mm · 2 of 28 slices shown (1 of 2)]
[im 1/28]
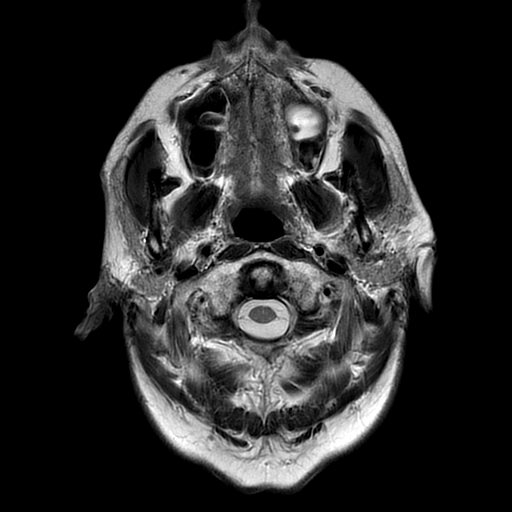
[im 28/28]
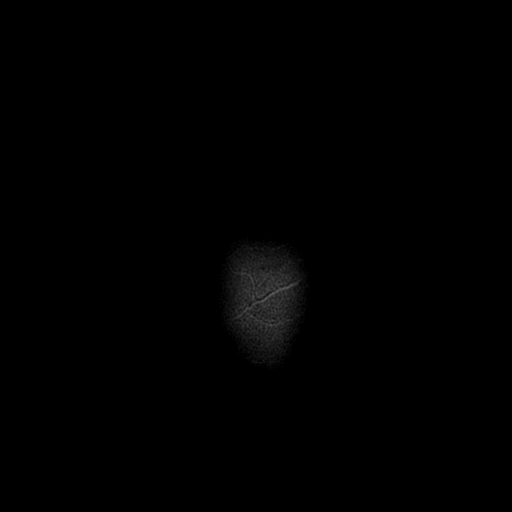

[Series 6: FLAIR · axial · 3.0mm · 0.49mm/px · z∈[-60,+117]mm · 2 of 31 slices shown (2 of 2)]
[im 1/31]
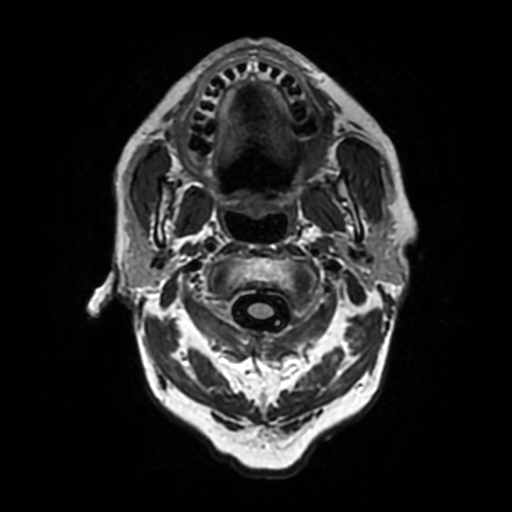
[im 31/31]
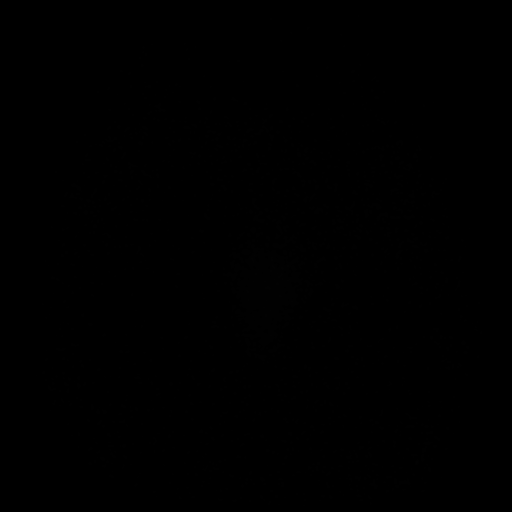

[Series 7: DWI · coronal · 4.0mm · 0.94mm/px · 4 of 72 slices shown (2 of 4)]
[im 1/72]
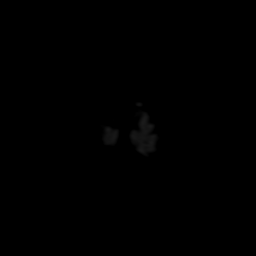
[im 24/72]
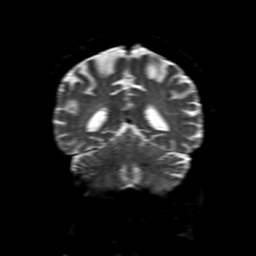
[im 48/72]
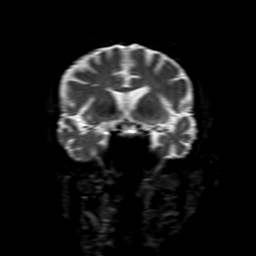
[im 72/72]
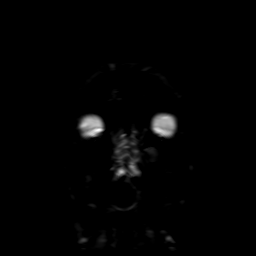

[Series 8: SWI · axial · 3.0mm · 0.47mm/px · z∈[-53,+28]mm · 4 of 112 slices shown]
[im 1/112]
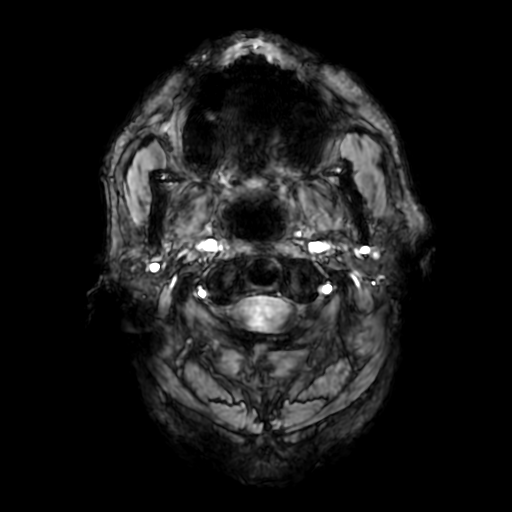
[im 19/112]
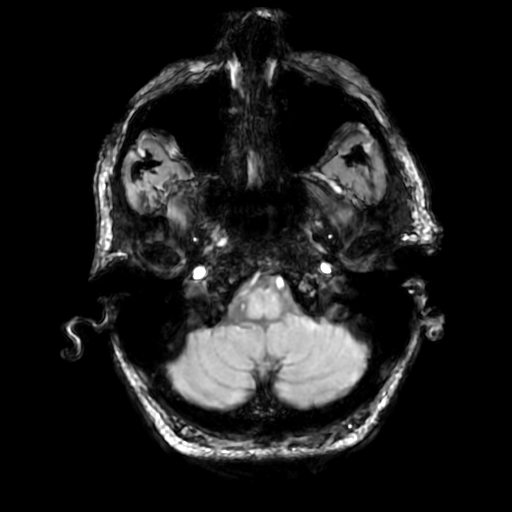
[im 38/112]
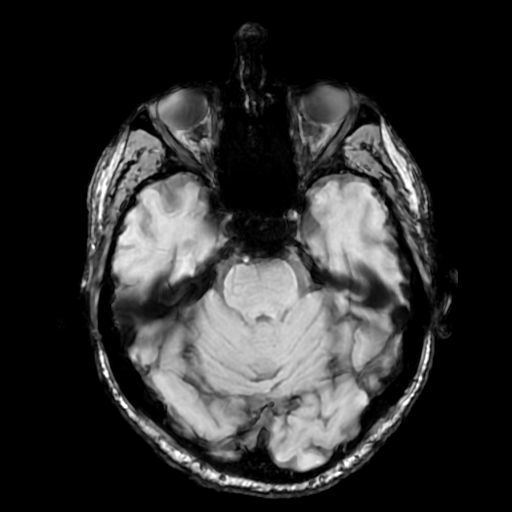
[im 56/112]
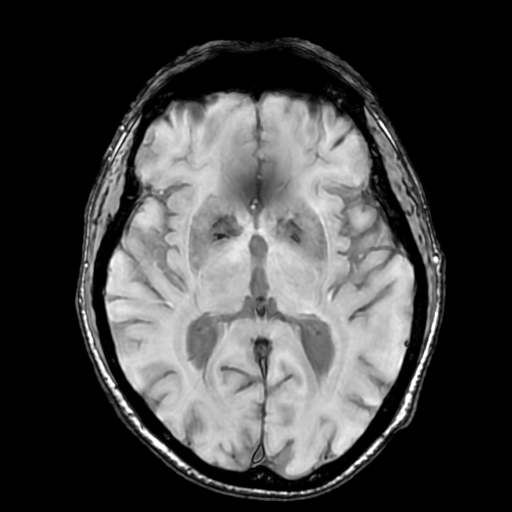

[Series 10: T2 · coronal · 5.0mm · 0.43mm/px · 2 of 33 slices shown (2 of 2)]
[im 1/33]
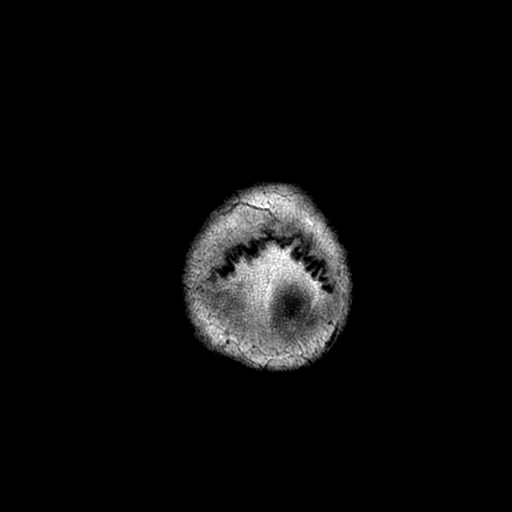
[im 33/33]
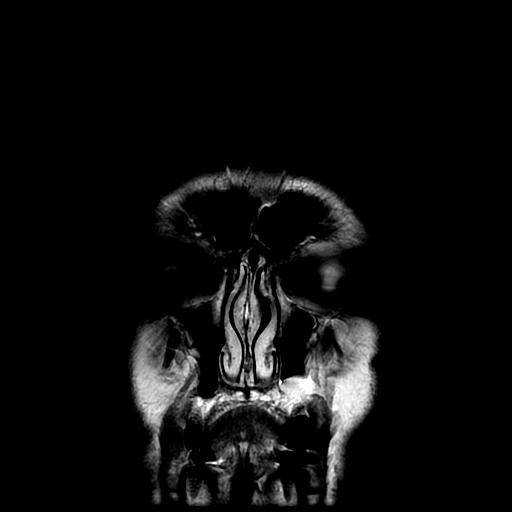

[Series 310: DWI · axial · 3.0mm · 0.94mm/px · z∈[-36,+106]mm · 6 of 100 slices shown (3 of 4)]
[im 1/100]
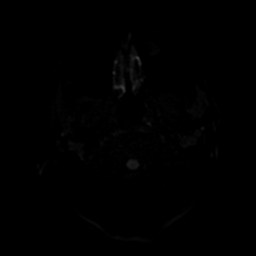
[im 20/100]
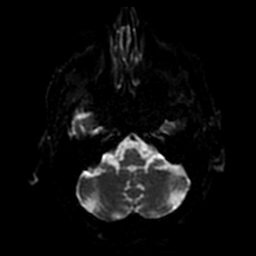
[im 40/100]
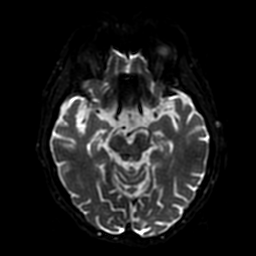
[im 60/100]
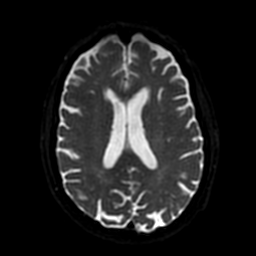
[im 80/100]
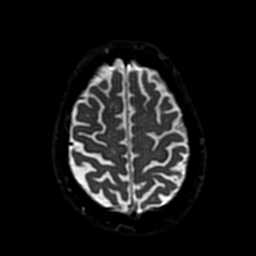
[im 100/100]
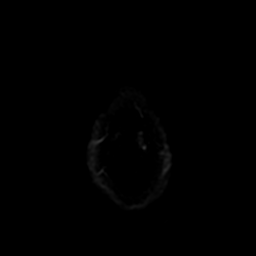

[Series 350: ADC · axial · 3.0mm · 0.94mm/px · z∈[-36,+106]mm · 3 of 50 slices shown (1 of 2)]
[im 1/50]
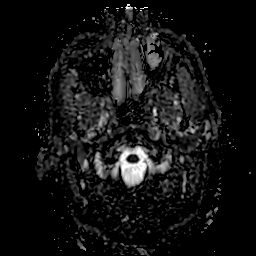
[im 25/50]
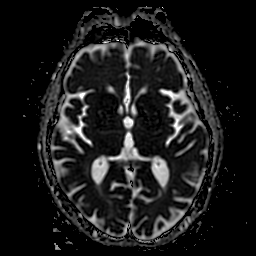
[im 50/50]
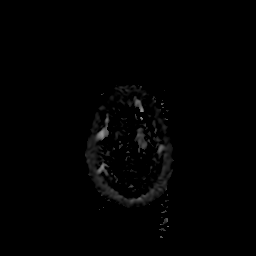

[Series 710: DWI · coronal · 4.0mm · 0.94mm/px · 4 of 72 slices shown (4 of 4)]
[im 1/72]
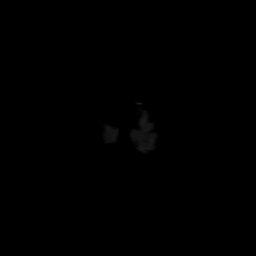
[im 24/72]
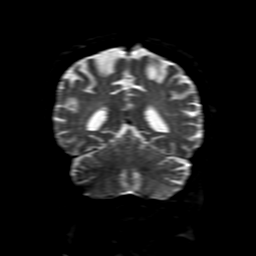
[im 48/72]
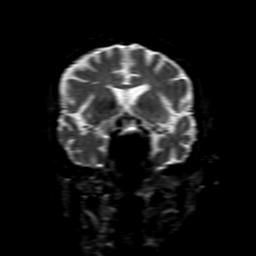
[im 72/72]
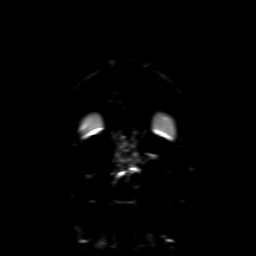

[Series 750: ADC · coronal · 4.0mm · 0.94mm/px · 2 of 36 slices shown (2 of 2)]
[im 1/36]
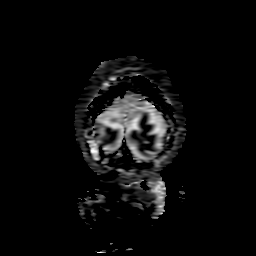
[im 36/36]
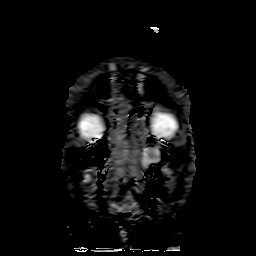

[36 of 48 positions shown; findings below may reference images not displayed]

FINDINGS: Brain: Diffuse atrophy without hydrocephalus. Negative for acute
infarct. Scattered small white matter hyperintensities in the deep
white matter bilaterally. Brainstem and cerebellum normal. Negative
for hemorrhage or mass.

Vascular: Normal arterial flow voids

Skull and upper cervical spine: Negative

Sinuses/Orbits: Left maxillary cyst.  Bilateral cataract surgery

Other: None

NeuroQuant Findings:

Volumetric analysis of the brain was performed, with a fully
detailed report in [HOSPITAL] PACS. Briefly, the comparison with age and
gender matched reference reveals cortical gray matter volume 25% of
age matched normals. Hippocampus volume less than 5% of age matched
normals.
IMPRESSION: 1. No acute abnormality
2. Atrophy and mild chronic white matter changes
3. Prominent hippocampal atrophy.
4. NeuroQuant volumetric analysis of the brain, see details on
[HOSPITAL] PACS.
